# Patient Record
Sex: Female | Born: 1995 | Race: Black or African American | Hispanic: No | Marital: Single | Smoking: Former smoker
Health system: Southern US, Community
[De-identification: ages and names within clinical notes are randomized; demographics above are authoritative.]

## PROBLEM LIST (undated history)

## (undated) DIAGNOSIS — D649 Anemia, unspecified: Secondary | ICD-10-CM

---
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
## 2014-06-01 ENCOUNTER — Inpatient Hospital Stay (HOSPITAL_COMMUNITY)
Admission: AD | Admit: 2014-06-01 | Discharge: 2014-06-02 | DRG: 781 | Disposition: A | Discharge status: Home / Self Care | Payer: Medicaid - Out of State | Source: Ambulatory Visit | Attending: Obstetrics & Gynecology | Admitting: Obstetrics & Gynecology

## 2014-06-01 ENCOUNTER — Inpatient Hospital Stay (HOSPITAL_COMMUNITY): Payer: Self-pay

## 2014-06-01 ENCOUNTER — Encounter (HOSPITAL_COMMUNITY): Payer: Self-pay | Admitting: *Deleted

## 2014-06-01 DIAGNOSIS — O2302 Infections of kidney in pregnancy, second trimester: Secondary | ICD-10-CM

## 2014-06-01 DIAGNOSIS — O2390 Unspecified genitourinary tract infection in pregnancy, unspecified trimester: Principal | ICD-10-CM | POA: Diagnosis present

## 2014-06-01 DIAGNOSIS — Z8249 Family history of ischemic heart disease and other diseases of the circulatory system: Secondary | ICD-10-CM

## 2014-06-01 DIAGNOSIS — Z833 Family history of diabetes mellitus: Secondary | ICD-10-CM

## 2014-06-01 DIAGNOSIS — N12 Tubulo-interstitial nephritis, not specified as acute or chronic: Secondary | ICD-10-CM | POA: Diagnosis present

## 2014-06-01 DIAGNOSIS — Z880 Allergy status to penicillin: Secondary | ICD-10-CM

## 2014-06-01 DIAGNOSIS — Z87891 Personal history of nicotine dependence: Secondary | ICD-10-CM

## 2014-06-01 HISTORY — DX: Anemia, unspecified: D64.9

## 2014-06-01 LAB — CBC WITH DIFFERENTIAL/PLATELET
Basophils Absolute: 0 10*3/uL (ref 0.0–0.1)
Basophils Relative: 0 % (ref 0–1)
EOS ABS: 0.1 10*3/uL (ref 0.0–0.7)
Eosinophils Relative: 1 % (ref 0–5)
HCT: 31.8 % — ABNORMAL LOW (ref 36.0–46.0)
Hemoglobin: 10.9 g/dL — ABNORMAL LOW (ref 12.0–15.0)
Lymphocytes Relative: 20 % (ref 12–46)
Lymphs Abs: 2.1 10*3/uL (ref 0.7–4.0)
MCH: 31.1 pg (ref 26.0–34.0)
MCHC: 34.3 g/dL (ref 30.0–36.0)
MCV: 90.6 fL (ref 78.0–100.0)
MONOS PCT: 9 % (ref 3–12)
Monocytes Absolute: 0.9 10*3/uL (ref 0.1–1.0)
NEUTROS PCT: 70 % (ref 43–77)
Neutro Abs: 7.6 10*3/uL (ref 1.7–7.7)
PLATELETS: 231 10*3/uL (ref 150–400)
RBC: 3.51 MIL/uL — ABNORMAL LOW (ref 3.87–5.11)
RDW: 13.3 % (ref 11.5–15.5)
WBC: 10.8 10*3/uL — ABNORMAL HIGH (ref 4.0–10.5)

## 2014-06-01 LAB — URINALYSIS, ROUTINE W REFLEX MICROSCOPIC
Bilirubin Urine: NEGATIVE
GLUCOSE, UA: NEGATIVE mg/dL
HGB URINE DIPSTICK: NEGATIVE
Ketones, ur: NEGATIVE mg/dL
Nitrite: NEGATIVE
Protein, ur: NEGATIVE mg/dL
SPECIFIC GRAVITY, URINE: 1.02 (ref 1.005–1.030)
Urobilinogen, UA: 0.2 mg/dL (ref 0.0–1.0)
pH: 5.5 (ref 5.0–8.0)

## 2014-06-01 LAB — URINE MICROSCOPIC-ADD ON

## 2014-06-01 MED ORDER — SODIUM CHLORIDE 0.9 % IV SOLN
INTRAVENOUS | Status: DC
  Administered 2014-06-01 – 2014-06-02 (×5): via INTRAVENOUS

## 2014-06-01 MED ORDER — DOCUSATE SODIUM 100 MG PO CAPS
100.0000 mg | ORAL_CAPSULE | Freq: Every day | ORAL | Status: DC
  Administered 2014-06-01: 100 mg via ORAL
  Filled 2014-06-01 (×2): qty 1

## 2014-06-01 MED ORDER — HYDROMORPHONE HCL PF 1 MG/ML IJ SOLN: 0.5000 mg | Freq: Once | INTRAMUSCULAR | Status: DC

## 2014-06-01 MED ORDER — CALCIUM CARBONATE ANTACID 500 MG PO CHEW
2.0000 | CHEWABLE_TABLET | ORAL | Status: DC | PRN
Start: 2014-06-01 — End: 2014-06-02
  Filled 2014-06-01: qty 2

## 2014-06-01 MED ORDER — PROMETHAZINE HCL 25 MG/ML IJ SOLN
12.5000 mg | Freq: Four times a day (QID) | INTRAMUSCULAR | Status: DC | PRN
  Administered 2014-06-01: 12.5 mg via INTRAVENOUS
  Filled 2014-06-01: qty 1

## 2014-06-01 MED ORDER — OXYCODONE-ACETAMINOPHEN 5-325 MG PO TABS
1.0000 | ORAL_TABLET | ORAL | Status: DC | PRN
  Administered 2014-06-01: 1 via ORAL
  Filled 2014-06-01: qty 1

## 2014-06-01 MED ORDER — DEXTROSE 5 % IV SOLN
1.0000 g | Freq: Two times a day (BID) | INTRAVENOUS | Status: DC
  Administered 2014-06-01 – 2014-06-02 (×4): 1 g via INTRAVENOUS
  Filled 2014-06-01 (×4): qty 10

## 2014-06-01 MED ORDER — SODIUM CHLORIDE 0.9 % IV SOLN: INTRAVENOUS | Status: DC

## 2014-06-01 MED ORDER — PRENATAL MULTIVITAMIN CH
1.0000 | ORAL_TABLET | Freq: Every day | ORAL | Status: DC
  Administered 2014-06-01: 1 via ORAL
  Filled 2014-06-01 (×2): qty 1

## 2014-06-01 MED ORDER — ZOLPIDEM TARTRATE 5 MG PO TABS: 5.0000 mg | ORAL_TABLET | Freq: Every evening | ORAL | Status: DC | PRN

## 2014-06-01 MED ORDER — ACETAMINOPHEN 325 MG PO TABS: 650.0000 mg | ORAL_TABLET | ORAL | Status: DC | PRN

## 2014-06-01 MED ORDER — HYDROMORPHONE HCL PF 1 MG/ML IJ SOLN
1.0000 mg | INTRAMUSCULAR | Status: DC | PRN
  Filled 2014-06-01: qty 1

## 2014-06-01 MED ORDER — HYDROMORPHONE HCL PF 1 MG/ML IJ SOLN
1.0000 mg | Freq: Once | INTRAMUSCULAR | Status: AC
  Administered 2014-06-01: 1 mg via INTRAVENOUS

## 2014-06-01 MED ORDER — DEXTROSE 5 % IV SOLN
1.0000 g | Freq: Once | INTRAVENOUS | Status: DC
  Filled 2014-06-01: qty 10

## 2014-06-01 NOTE — Care Management Note (Signed)
    Page 1 of 1   06/01/2014     12:10:08 PM CARE MANAGEMENT NOTE 06/01/2014  Patient:  Tabitha Stanton, Tabitha Stanton   Account Number:  [id number]  Date Initiated:  06/01/2014  Documentation initiated by:  [facility]  Subjective/Objective Assessment:   adm: GU infection     Action/Plan:   med asst   Anticipated DC Date:  06/01/2014   Anticipated DC Plan:  HOME/SELF CARE      DC Planning Services  MATCH Program  CM consult      Choice offered to / List presented to:             Status of service:   Medicare Important Message given?   (If response is "NO", the following Medicare IM given date fields will be blank) Date Medicare IM given:   Medicare IM given by:   Date Additional Medicare IM given:   Additional Medicare IM given by:    Discharge Disposition:  HOME/SELF CARE  Per UR Regulation:    If discussed at Long Length of Stay Meetings, dates discussed:    Comments:  06/01/2014 12:00 CM received call from CSW at [facility] requesting MATCH for pt discharging today.  CM called and spoke with RN, Bella Kennedy, who states pt has follow up care in [place] where she is going but need MATCH.  RN also states she is familiar with MATCH program and will educate pt.  CM MATCHed pt and faxed MATCH letter and list of participating pharmacies to RN who will provide pt with education.  No other CM needs were communicated.  Freddy Jaksch, BSN, CM [phone].

## 2014-06-01 NOTE — H&P (Signed)
History     CSN: [id number]  Arrival date and time: 06/01/14 0046   First Provider Initiated Contact with Patient 06/01/14 0120      Chief Complaint  Patient presents with  . Back Pain  . Abdominal Pain   HPI [name] who presents with c/o Right flank pain which worsened this week. Was diagnosed in [place] 3 weeks ago with a "UTI" but could not afford meds. Is here visiting and her grandparents bought her antibiotics for her. She called her doctor and they told her she also had GC and Chlamydia. They called her in Zithromax and Macrobid. Did not get treated for GC. Denies fever.  Vomited after taking Zithromax but pills did not come up.   RN Note:  Told to take Azithromycin 500mg 1 capsule every 10mins x 4. For GCand chlamydia. Took last capusule about 2255 and started havng some back pain and then threw up. Pain radiates around to upper abd in front. Pain is stabbing. Also taking med for uti. Gets [facility] in [place]. Visiting in [place].        OB History   Grav Para Term Preterm Abortions TAB SAB Ect Mult Living   2 2 2       1      Past Medical History  Diagnosis Date  . Anemia     Past Surgical History  Procedure Laterality Date  . Gallbladder surgery  2014    Family History  Problem Relation Age of Onset  . Diabetes Maternal Uncle   . Cancer Maternal Grandmother   . Heart disease Paternal Grandmother     History  Substance Use Topics  . Smoking status: Former Smoker  . Smokeless tobacco: Not on file  . Alcohol Use: No    Allergies:  Allergies  Allergen Reactions  . Penicillins     Stopped breathing as infant per pt    Prescriptions prior to admission  Medication Sig Dispense Refill  . azithromycin (ZITHROMAX) 500 MG tablet Take 2,000 mg by mouth daily.      . nitrofurantoin (MACRODANTIN) 100 MG capsule Take 100 mg by mouth 2 (two) times daily.      . Prenatal Vit-Fe Fumarate-FA (PRENATAL MULTIVITAMIN) TABS tablet Take 1 tablet by  mouth daily at 12 noon.        Review of Systems  Constitutional: Negative for fever, chills and malaise/fatigue.  Gastrointestinal: Positive for vomiting and abdominal pain. Negative for nausea, diarrhea and constipation.  Genitourinary: Positive for flank pain. Negative for dysuria.   Physical Exam   Blood pressure 111/75, pulse 103, temperature 97.8 F (36.6 C), temperature source Oral, resp. rate 20, SpO2 99.00%, unknown if currently breastfeeding.  Physical Exam  Constitutional: She is oriented to person, place, and time. She appears well-developed and well-nourished. No distress (but uncomfortable, lying on left side, holding her right flank).  HENT:  Head: Normocephalic.  Cardiovascular: Normal rate.   Respiratory: Effort normal.  GI: Soft. She exhibits no distension and no mass. There is tenderness. There is no rebound and no guarding.  Significant Right CVA tenderness   Musculoskeletal: Normal range of motion.  Neurological: She is alert and oriented to person, place, and time.  Skin: Skin is warm and dry.  Psychiatric: She has a normal mood and affect.    MAU Course  Procedures  MDM Will check UA and culture as well as CBC/diff, then will consult with Dr Leggett re: pyelonephritis Results for orders   placed during the hospital encounter of 06/01/14 (from the past 24 hour(s))  URINALYSIS, ROUTINE W REFLEX MICROSCOPIC     Status: Abnormal   Collection Time    06/01/14  1:35 AM      Result Value Ref Range   Color, Urine YELLOW  YELLOW   APPearance CLEAR  CLEAR   Specific Gravity, Urine 1.020  1.005 - 1.030   pH 5.5  5.0 - 8.0   Glucose, UA NEGATIVE  NEGATIVE mg/dL   Hgb urine dipstick NEGATIVE  NEGATIVE   Bilirubin Urine NEGATIVE  NEGATIVE   Ketones, ur NEGATIVE  NEGATIVE mg/dL   Protein, ur NEGATIVE  NEGATIVE mg/dL   Urobilinogen, UA 0.2  0.0 - 1.0 mg/dL   Nitrite NEGATIVE  NEGATIVE   Leukocytes, UA MODERATE (*) NEGATIVE  CBC WITH DIFFERENTIAL     Status:  Abnormal   Collection Time    06/01/14  1:35 AM      Result Value Ref Range   WBC 10.8 (*) 4.0 - 10.5 K/uL   RBC 3.51 (*) 3.87 - 5.11 MIL/uL   Hemoglobin 10.9 (*) 12.0 - 15.0 g/dL   HCT 31.8 (*) 36.0 - 46.0 %   MCV 90.6  78.0 - 100.0 fL   MCH 31.1  26.0 - 34.0 pg   MCHC 34.3  30.0 - 36.0 g/dL   RDW 13.3  11.5 - 15.5 %   Platelets 231  150 - 400 K/uL   Neutrophils Relative % 70  43 - 77 %   Neutro Abs 7.6  1.7 - 7.7 K/uL   Lymphocytes Relative 20  12 - 46 %   Lymphs Abs 2.1  0.7 - 4.0 K/uL   Monocytes Relative 9  3 - 12 %   Monocytes Absolute 0.9  0.1 - 1.0 K/uL   Eosinophils Relative 1  0 - 5 %   Eosinophils Absolute 0.1  0.0 - 0.7 K/uL   Basophils Relative 0  0 - 1 %   Basophils Absolute 0.0  0.0 - 0.1 K/uL  URINE MICROSCOPIC-ADD ON     Status: Abnormal   Collection Time    06/01/14  1:35 AM      Result Value Ref Range   Squamous Epithelial / LPF RARE  RARE   WBC, UA 7-10  <3 WBC/hpf   RBC / HPF 0-2  <3 RBC/hpf   Bacteria, UA FEW (*) RARE    Assessment and Plan  A:  SIUP at [redacted]w[redacted]d        Presumed pyelonephritis       Reported GC and Chlamydia (from doctor in [place])  P:  Consulted Dr Leggett       Admit to hospital for IV Rocephin       Will get Renal US to rule out stone, since UA does not look overly suspicious        IV hydration  Tabitha Stanton 06/01/2014, 1:37 AM  

## 2014-06-01 NOTE — MAU Note (Signed)
Report called to Allycha RN on Women's Unit.

## 2014-06-01 NOTE — Progress Notes (Signed)
Match prescription assistance program discussed with patient.  No questions at this time.  Patient has information at bedside.  Will continue to monitor.  Osvaldo Angst, RN----------

## 2014-06-01 NOTE — MAU Provider Note (Signed)
Attestation of Attending Supervision of Advanced Practitioner (CNM/NP): Evaluation and management procedures were performed by the Advanced Practitioner under my supervision and collaboration. I have reviewed the Advanced Practitioner's note and chart, and I agree with the management and plan.  Lailoni Baquera H. 7:43 AM

## 2014-06-01 NOTE — Progress Notes (Signed)
Case was referred to casemanagement.

## 2014-06-01 NOTE — MAU Provider Note (Signed)
History     CSN: [id number]  Arrival date and time: 06/01/14 4098   First Provider Initiated Contact with Patient 06/01/14 0120      Chief Complaint  Patient presents with  . Back Pain  . Abdominal Pain   HPI THis is a 18 y.o. female at [redacted]w[redacted]d who presents with c/o Right flank pain which worsened this week. Was diagnosed in [place] 3 weeks ago with a "UTI" but could not afford meds. Is here visiting and her grandparents bought her antibiotics for her. She called her doctor and they told her she also had GC and Chlamydia. They called her in Zithromax and Macrobid. Did not get treated for GC. Denies fever.  Vomited after taking Zithromax but pills did not come up.   RN Note:  Told to take Azithromycin  1 capsule every x 4. For GCand chlamydia. Took last capusule about 2255 and started havng some back pain and then threw up. Pain radiates around to upper abd in front. Pain is stabbing. Also taking med for uti. Gets [facility] in [place]. Visiting in [place].        OB History   Grav Para Term Preterm Abortions TAB SAB Ect Mult Living   Past Medical History  Diagnosis Date  . Anemia     Past Surgical History  Procedure Laterality Date  . Gallbladder surgery  2014    Family History  Problem Relation Age of Onset  . Diabetes Maternal Uncle   . Cancer Maternal Grandmother   . Heart disease Paternal Grandmother     History  Substance Use Topics  . Smoking status: Former Games developer  . Smokeless tobacco: Not on file  . Alcohol Use: No    Allergies:  Allergies  Allergen Reactions  . Penicillins     Stopped breathing as infant per pt    Prescriptions prior to admission  Medication Sig Dispense Refill  . azithromycin (ZITHROMAX) 500 MG tablet Take 2,000 mg by mouth daily.      . nitrofurantoin (MACRODANTIN) 100 MG capsule Take 100 mg by mouth 2 (two) times daily.      . Prenatal Vit-Fe Fumarate-FA (PRENATAL MULTIVITAMIN) TABS tablet Take 1 tablet by  mouth daily at 12 noon.        Review of Systems  Constitutional: Negative for fever, chills and malaise/fatigue.  Gastrointestinal: Positive for vomiting and abdominal pain. Negative for nausea, diarrhea and constipation.  Genitourinary: Positive for flank pain. Negative for dysuria.   Physical Exam   Blood pressure 111/75, pulse 103, temperature 97.8 F (36.6 C), temperature source Oral, resp. rate 20, SpO2 99.00%, unknown if currently breastfeeding.  Physical Exam  Constitutional: She is oriented to person, place, and time. She appears well-developed and well-nourished. No distress (but uncomfortable, lying on left side, holding her right flank).  HENT:  Head: Normocephalic.  Cardiovascular: Normal rate.   Respiratory: Effort normal.  GI: Soft. She exhibits no distension and no mass. There is tenderness. There is no rebound and no guarding.  Significant Right CVA tenderness   Musculoskeletal: Normal range of motion.  Neurological: She is alert and oriented to person, place, and time.  Skin: Skin is warm and dry.  Psychiatric: She has a normal mood and affect.    MAU Course  Procedures  MDM Will check UA and culture as well as CBC/diff, then will consult with Dr Penne Lash re: pyelonephritis Results for orders  placed during the hospital encounter of 06/01/14 (from the past 24 hour(s))  URINALYSIS, ROUTINE W REFLEX MICROSCOPIC     Status: Abnormal   Collection Time    06/01/14  1:35 AM      Result Value Ref Range   Color, Urine YELLOW  YELLOW   APPearance CLEAR  CLEAR   Specific Gravity, Urine 1.020  1.005 - 1.030   pH 5.5  5.0 - 8.0   Glucose, UA NEGATIVE  NEGATIVE mg/dL   Hgb urine dipstick NEGATIVE  NEGATIVE   Bilirubin Urine NEGATIVE  NEGATIVE   Ketones, ur NEGATIVE  NEGATIVE mg/dL   Protein, ur NEGATIVE  NEGATIVE mg/dL   Urobilinogen, UA 0.2  0.0 - 1.0 mg/dL   Nitrite NEGATIVE  NEGATIVE   Leukocytes, UA MODERATE (*) NEGATIVE  CBC WITH DIFFERENTIAL     Status:  Abnormal   Collection Time    06/01/14  1:35 AM      Result Value Ref Range   WBC 10.8 (*) 4.0 - 10.5 K/uL   RBC 3.51 (*) 3.87 - 5.11 MIL/uL   Hemoglobin 10.9 (*) 12.0 - 15.0 g/dL   HCT 16.1 (*) 09.6 - 04.5 %   MCV 90.6  78.0 - 100.0 fL   MCH 31.1  26.0 - 34.0 pg   MCHC 34.3  30.0 - 36.0 g/dL   RDW 40.9  81.1 - 91.4 %   Platelets 231  150 - 400 K/uL   Neutrophils Relative % 70  43 - 77 %   Neutro Abs 7.6  1.7 - 7.7 K/uL   Lymphocytes Relative 20  12 - 46 %   Lymphs Abs 2.1  0.7 - 4.0 K/uL   Monocytes Relative 9  3 - 12 %   Monocytes Absolute 0.9  0.1 - 1.0 K/uL   Eosinophils Relative 1  0 - 5 %   Eosinophils Absolute 0.1  0.0 - 0.7 K/uL   Basophils Relative 0  0 - 1 %   Basophils Absolute 0.0  0.0 - 0.1 K/uL  URINE MICROSCOPIC-ADD ON     Status: Abnormal   Collection Time    06/01/14  1:35 AM      Result Value Ref Range   Squamous Epithelial / LPF RARE  RARE   WBC, UA 7-10  <3 WBC/hpf   RBC / HPF 0-2  <3 RBC/hpf   Bacteria, UA FEW (*) RARE    Assessment and Plan  A:  SIUP at [redacted]w[redacted]d        Presumed pyelonephritis       Reported GC and Chlamydia (from doctor in [place])  P:  Consulted Dr Penne Lash       Admit to hospital for IV Rocephin       Will get Renal US to rule out stone, since UA does not look overly suspicious        IV hydration  Jeret Goyer 06/01/2014, 1:37 AM

## 2014-06-01 NOTE — MAU Note (Addendum)
Told to take Azithromycin  1 capsule every x 4. For GCand chlamydia.  Took last capusule about 2255 and started havng some back pain and then threw up. Pain radiates around to upper abd in front. Pain is stabbing. Also taking med for uti. Gets [facility] in [place]. Visiting in [place].

## 2014-06-01 NOTE — H&P (Signed)
Attestation of Attending Supervision of Advanced Practitioner (CNM/NP): Evaluation and management procedures were performed by the Advanced Practitioner under my supervision and collaboration. I have reviewed the Advanced Practitioner's note and chart, and I agree with the management and plan.  Manda Holstad H. 7:43 AM   

## 2014-06-01 NOTE — Progress Notes (Signed)
To 320 via w/c

## 2014-06-02 DIAGNOSIS — N12 Tubulo-interstitial nephritis, not specified as acute or chronic: Secondary | ICD-10-CM

## 2014-06-02 DIAGNOSIS — O2390 Unspecified genitourinary tract infection in pregnancy, unspecified trimester: Principal | ICD-10-CM

## 2014-06-02 LAB — CULTURE, OB URINE
COLONY COUNT: NO GROWTH
Culture: NO GROWTH
SPECIAL REQUESTS: NORMAL

## 2014-06-02 MED ORDER — CEPHALEXIN 500 MG PO CAPS: 500.0000 mg | ORAL_CAPSULE | Freq: Four times a day (QID) | ORAL | Status: AC

## 2014-06-02 NOTE — Discharge Instructions (Signed)
Pyelonephritis, Adult °Pyelonephritis is a kidney infection. In general, there are 2 main types of pyelonephritis: °· Infections that come on quickly without any warning (acute pyelonephritis). °· Infections that persist for a long period of time (chronic pyelonephritis). °CAUSES  °Two main causes of pyelonephritis are: °· Bacteria traveling from the bladder to the kidney. This is a problem especially in pregnant women. The urine in the bladder can become filled with bacteria from multiple causes, including: °¨ Inflammation of the prostate gland (prostatitis). °¨ Sexual intercourse in females. °¨ Bladder infection (cystitis). °· Bacteria traveling from the bloodstream to the tissue part of the kidney. °Problems that may increase your risk of getting a kidney infection include: °· Diabetes. °· Kidney stones or bladder stones. °· Cancer. °· Catheters placed in the bladder. °· Other abnormalities of the kidney or ureter. °SYMPTOMS  °· Abdominal pain. °· Pain in the side or flank area. °· Fever. °· Chills. °· Upset stomach. °· Blood in the urine (dark urine). °· Frequent urination. °· Strong or persistent urge to urinate. °· Burning or stinging when urinating. °DIAGNOSIS  °Your caregiver may diagnose your kidney infection based on your symptoms. A urine sample may also be taken. °TREATMENT  °In general, treatment depends on how severe the infection is.  °· If the infection is mild and caught early, your caregiver may treat you with oral antibiotics and send you home. °· If the infection is more severe, the bacteria may have gotten into the bloodstream. This will require intravenous (IV) antibiotics and a hospital stay. Symptoms may include: °¨ High fever. °¨ Severe flank pain. °¨ Shaking chills. °· Even after a hospital stay, your caregiver may require you to be on oral antibiotics for a period of time. °· Other treatments may be required depending upon the cause of the infection. °HOME CARE INSTRUCTIONS  °· Take your  antibiotics as directed. Finish them even if you start to feel better. °· Make an appointment to have your urine checked to make sure the infection is gone. °· Drink enough fluids to keep your urine clear or pale yellow. °· Take medicines for the bladder if you have urgency and frequency of urination as directed by your caregiver. °SEEK IMMEDIATE MEDICAL CARE IF:  °· You have a fever or persistent symptoms for more than 2-3 days. °· You have a fever and your symptoms suddenly get worse. °· You are unable to take your antibiotics or fluids. °· You develop shaking chills. °· You experience extreme weakness or fainting. °· There is no improvement after 2 days of treatment. °MAKE SURE YOU: °· Understand these instructions. °· Will watch your condition. °· Will get help right away if you are not doing well or get worse. °Document Released: 09/13/2005 Document Revised: 03/14/2012 Document Reviewed: 02/17/2011 °ExitCare® Patient Information ©2015 [employer]. This information is not intended to replace advice given to you by your health care provider. Make sure you discuss any questions you have with your health care provider. ° °

## 2014-06-02 NOTE — Progress Notes (Signed)
Discharge instructions provided to patient at bedside.  Activities, medications, follow up appointments, when to call the doctor, community resources and Match Program discussed with patient.  No questions at this time.  Patient left unit in stable condition with all personal belongings accompanied by Clinical research associate.  Osvaldo Angst, RN------------

## 2014-06-02 NOTE — Discharge Summary (Signed)
Physician Discharge Summary  Patient ID: Tabitha Stanton MRN: [id number] DOB/AGE: 11/22/1995 18 y.o.  Admit date: 06/01/2014 Discharge date: 06/02/2014   Discharge Diagnoses:  Active Problems:   Pyelonephritis affecting pregnancy in second trimester, antepartum   Consults: None  Significant Diagnostic Studies: labs:  CBC    Component Value Date/Time   WBC 10.8* 06/01/2014 0135   RBC 3.51* 06/01/2014 0135   HGB 10.9* 06/01/2014 0135   HCT 31.8* 06/01/2014 0135   PLT 231 06/01/2014 0135   MCV 90.6 06/01/2014 0135   MCH 31.1 06/01/2014 0135   MCHC 34.3 06/01/2014 0135   RDW 13.3 06/01/2014 0135   LYMPHSABS 2.1 06/01/2014 0135   MONOABS 0.9 06/01/2014 0135   EOSABS 0.1 06/01/2014 0135   BASOSABS 0.0 06/01/2014 0135    Urinalysis    Component Value Date/Time   COLORURINE YELLOW 06/01/2014 0135   APPEARANCEUR CLEAR 06/01/2014 0135   LABSPEC 1.020 06/01/2014 0135   PHURINE 5.5 06/01/2014 0135   GLUCOSEU NEGATIVE 06/01/2014 0135   HGBUR NEGATIVE 06/01/2014 0135   BILIRUBINUR NEGATIVE 06/01/2014 0135   KETONESUR NEGATIVE 06/01/2014 0135   PROTEINUR NEGATIVE 06/01/2014 0135   UROBILINOGEN 0.2 06/01/2014 0135   NITRITE NEGATIVE 06/01/2014 0135   LEUKOCYTESUR MODERATE* 06/01/2014 0135      and radiology: US Renal  06/01/2014   CLINICAL DATA:  Back pain. Pyelonephritis. Seventeen weeks 3 days pregnant.  EXAM: RENAL/URINARY TRACT ULTRASOUND COMPLETE  COMPARISON:  None.  FINDINGS: Right Kidney:  Length: 11 cm. Moderate right hydronephrosis. Renal parenchymal echotexture and thickness are normal.  Left Kidney:  Length: 11.8 cm. Mild prominence of intrarenal collecting system on the left. Renal parenchymal echotexture and thickness are normal.  Bladder:  Bladder wall is not thickened. No intraluminal filling defects. Bilateral urine flow jets are demonstrated.  IMPRESSION: Bilateral hydronephrosis, greater on the right. The etiology is nonspecific but could be due to compression of the pregnancy. Distal ureters are not visualized.    Electronically Signed   By: Burman Nieves M.D.   On: 06/01/2014 04:22   Hospital Course: Patient admited with elevated WBC, CVA tenderness and known UTI without complete treatment.  Started on Rocephin x 24 hours.  Tolerated it well.  CVA tenderness improved.  Remained afebrile.  Urine culture from [facility] reviewed.  E.coli sensitive to cefazolin, but resistant to Cipro, Gent, Sulfa. Had Macrobid, but will change discharge abx to Keflex. Renal U/s did not show evidence of stone, but some mild hydronephrosis.  Discharge exam: Physical Examination: General appearance - alert, well appearing, and in no distress Chest - normal effot Heart - normal rate and regular rhythm Abdomen - soft, nontender, nondistended, no masses or organomegaly Musculoskeletal - minimal right CVA tenderness Extremities - peripheral pulses normal, no pedal edema, no clubbing or cyanosis Skin - normal coloration and turgor, no rashes, no suspicious skin lesions noted  Disposition: to home Discharged Condition: improved  Discharge Instructions   Discharge activity:  No Restrictions    Complete by:  As directed      Discharge diet:  No restrictions    Complete by:  As directed      No sexual activity restrictions    Complete by:  As directed             Medication List    STOP taking these medications       azithromycin 500 MG tablet  Commonly known as:  ZITHROMAX     nitrofurantoin 100 MG capsule  Commonly known as:  MACRODANTIN  TAKE these medications       cephALEXin 500 MG capsule  Commonly known as:  KEFLEX  Take 1 capsule (500 mg total) by mouth 4 (four) times daily.     prenatal multivitamin Tabs tablet  Take 1 tablet by mouth daily at 12 noon.           Follow-up Information   Follow up with [facility]-in [place]. Call in 1 week. (If symptoms worsen and for hospital f/u)       Signed: Abbie Berling S 06/02/2014, 7:53 AM

## 2014-07-30 ENCOUNTER — Encounter (HOSPITAL_COMMUNITY): Payer: Self-pay | Admitting: *Deleted

## 2016-03-12 IMAGING — US US RENAL
1 series · 14 of 25 positions shown · non-contrast
Comparison: None.

CLINICAL DATA: Back pain. Pyelonephritis. Seventeen weeks 3 days
pregnant.

EXAM:
RENAL/URINARY TRACT ULTRASOUND COMPLETE

[Series 1: us renal · 0.20mm/px · 14 of 68 slices shown]
[im 1/68]
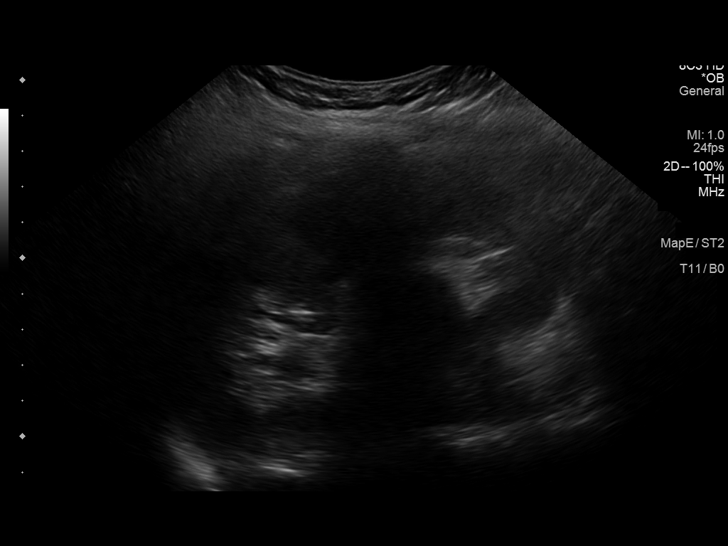
[im 6/68]
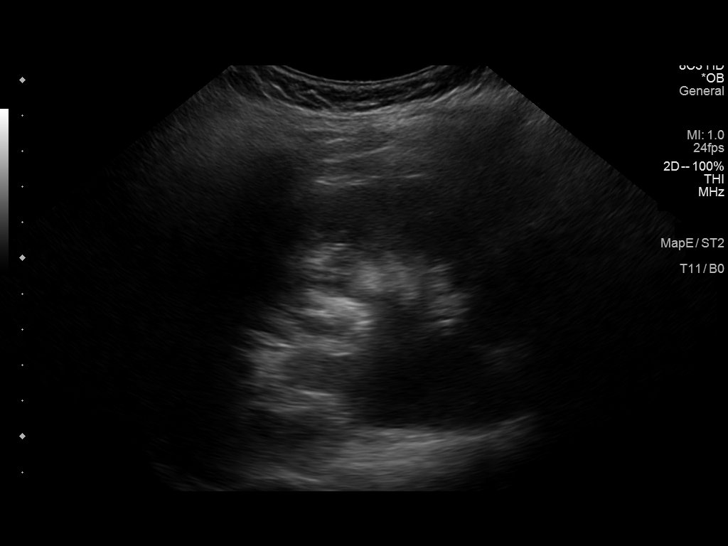
[im 12/68]
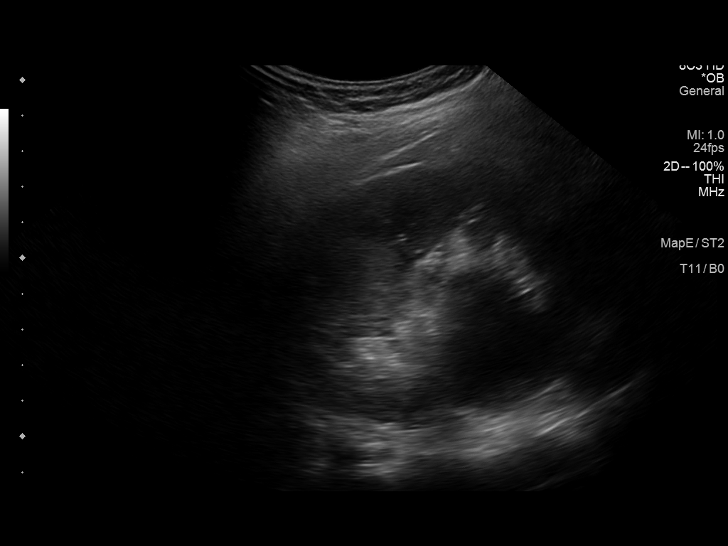
[im 17/68]
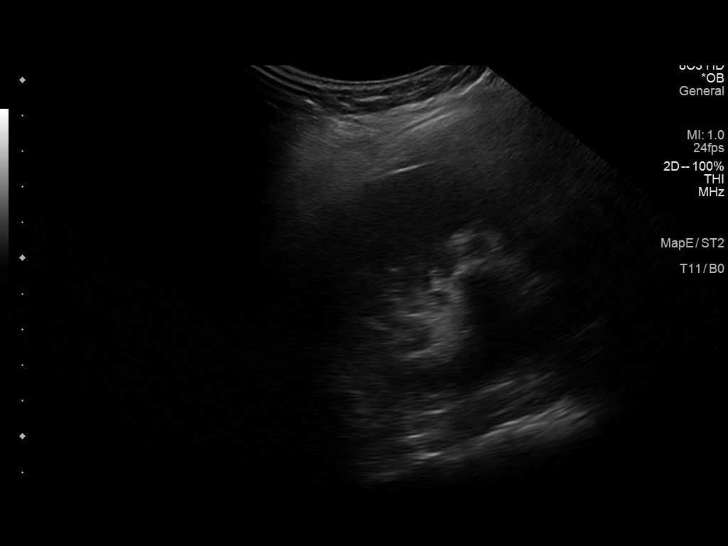
[im 23/68]
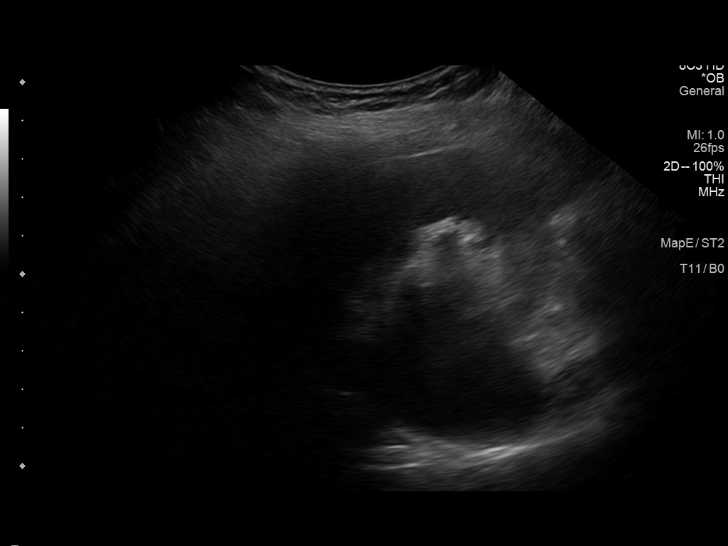
[im 26/68]
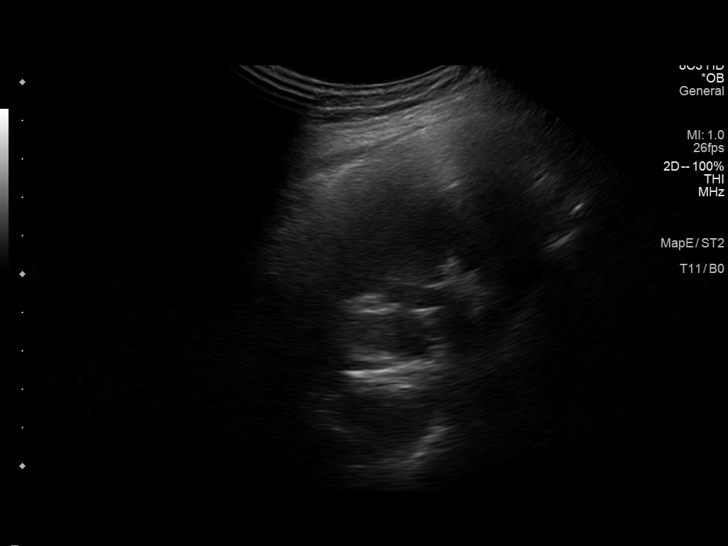
[im 31/68]
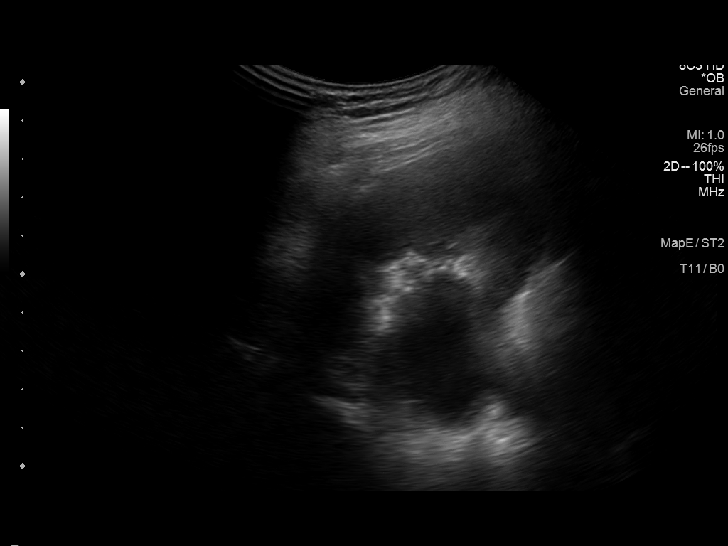
[im 37/68]
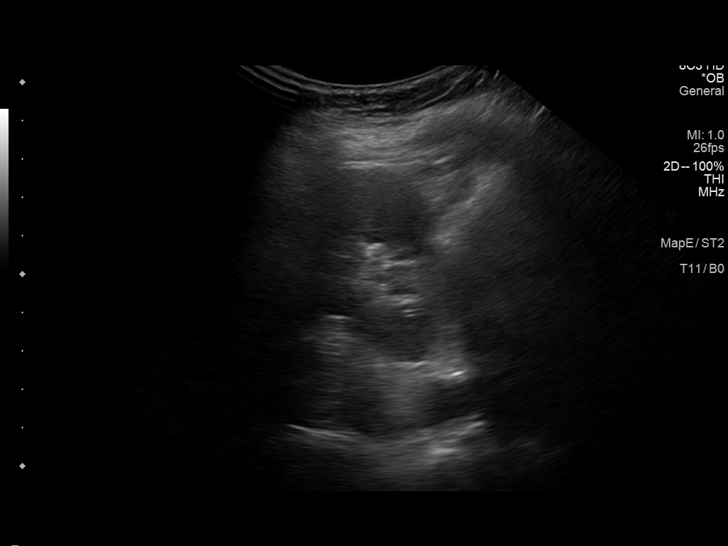
[im 42/68]
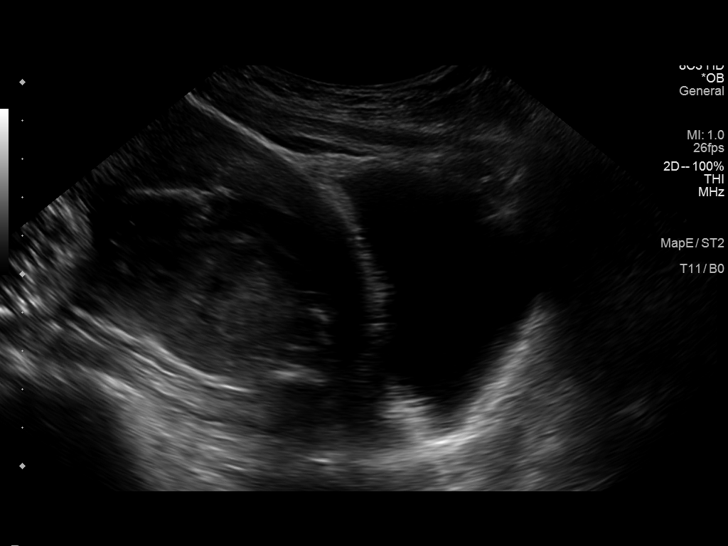
[im 45/68]
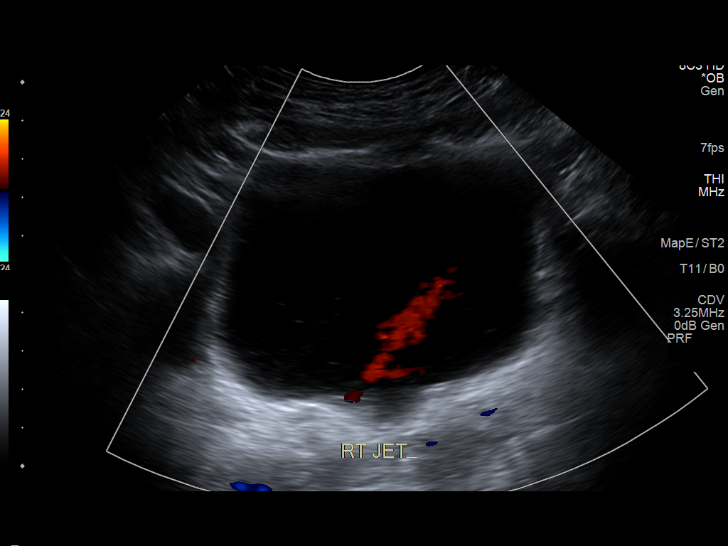
[im 51/68]
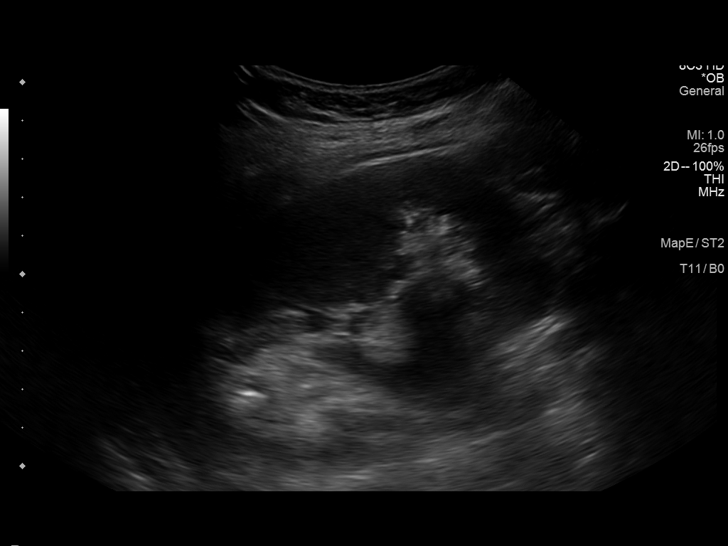
[im 56/68]
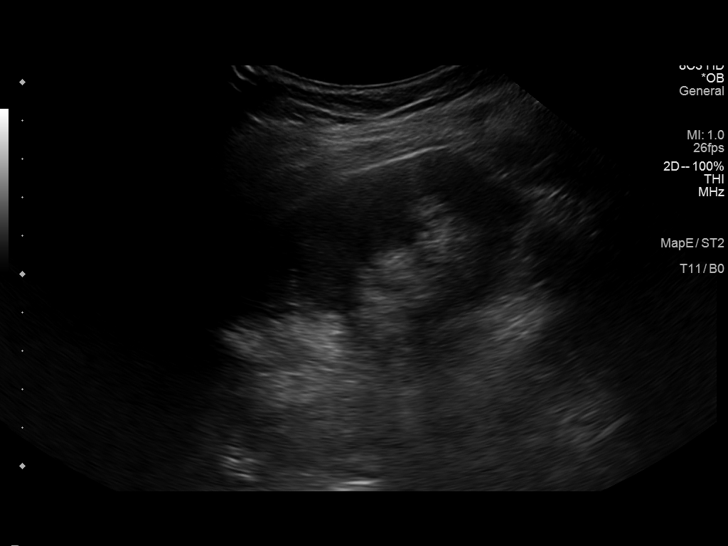
[im 62/68]
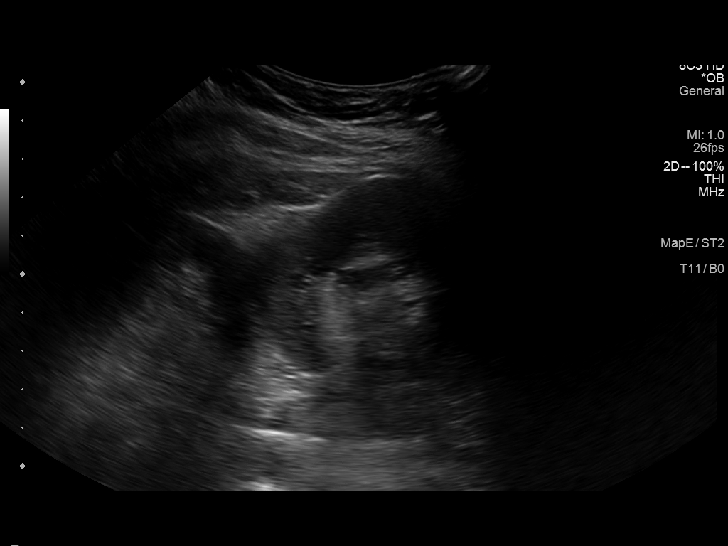
[im 68/68]
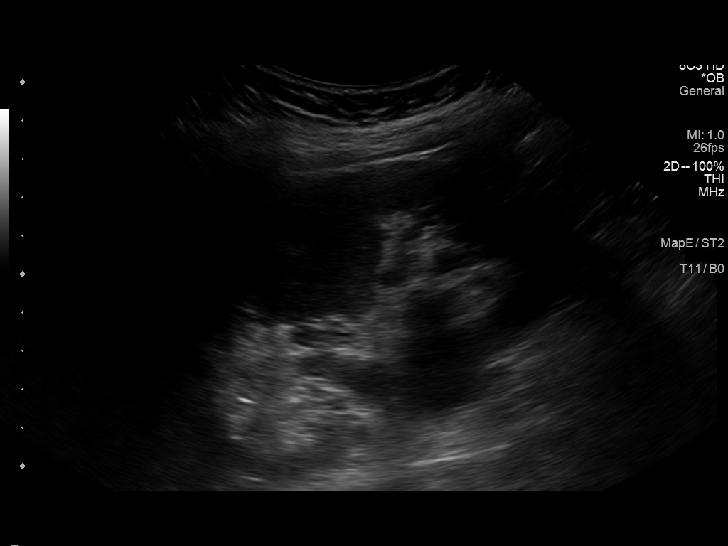

[14 of 25 positions shown; findings below may reference images not displayed]

FINDINGS: Right Kidney:

Length: 11 cm. Moderate right hydronephrosis. Renal parenchymal
echotexture and thickness are normal.

Left Kidney:

Length: 11.8 cm. Mild prominence of intrarenal collecting system on
the left. Renal parenchymal echotexture and thickness are normal.

Bladder:

Bladder wall is not thickened. No intraluminal filling defects.
Bilateral urine flow jets are demonstrated.
IMPRESSION: Bilateral hydronephrosis, greater on the right. The etiology is
nonspecific but could be due to compression of the pregnancy. Distal
ureters are not visualized.
